# Patient Record
Sex: Female | Born: 1960
Health system: Southern US, Community
[De-identification: ages and names within clinical notes are randomized; demographics above are authoritative.]

## PROBLEM LIST (undated history)

## (undated) DIAGNOSIS — I10 Essential (primary) hypertension: Secondary | ICD-10-CM

## (undated) DIAGNOSIS — K219 Gastro-esophageal reflux disease without esophagitis: Secondary | ICD-10-CM

## (undated) DIAGNOSIS — C801 Malignant (primary) neoplasm, unspecified: Secondary | ICD-10-CM

## (undated) DIAGNOSIS — K759 Inflammatory liver disease, unspecified: Secondary | ICD-10-CM

## (undated) DIAGNOSIS — L309 Dermatitis, unspecified: Secondary | ICD-10-CM

## (undated) HISTORY — PX: NO PAST SURGERIES: SHX2092

---

## 1997-11-26 ENCOUNTER — Other Ambulatory Visit: Admission: RE | Admit: 1997-11-26 | Discharge: 1997-11-26 | Payer: Self-pay | Admitting: Obstetrics & Gynecology

## 1999-08-07 ENCOUNTER — Other Ambulatory Visit: Admission: RE | Admit: 1999-08-07 | Discharge: 1999-08-07 | Payer: Self-pay | Admitting: Obstetrics & Gynecology

## 2001-01-24 ENCOUNTER — Other Ambulatory Visit: Admission: RE | Admit: 2001-01-24 | Discharge: 2001-01-24 | Payer: Self-pay | Admitting: Obstetrics & Gynecology

## 2002-02-07 ENCOUNTER — Other Ambulatory Visit: Admission: RE | Admit: 2002-02-07 | Discharge: 2002-02-07 | Payer: Self-pay | Admitting: Obstetrics & Gynecology

## 2002-03-13 ENCOUNTER — Ambulatory Visit (HOSPITAL_COMMUNITY): Admission: RE | Admit: 2002-03-13 | Discharge: 2002-03-13 | Payer: Self-pay | Admitting: Gastroenterology

## 2002-03-13 ENCOUNTER — Encounter: Payer: Self-pay | Admitting: Gastroenterology

## 2002-03-27 ENCOUNTER — Encounter (INDEPENDENT_AMBULATORY_CARE_PROVIDER_SITE_OTHER): Payer: Self-pay | Admitting: Specialist

## 2002-03-27 ENCOUNTER — Ambulatory Visit (HOSPITAL_COMMUNITY): Admission: RE | Admit: 2002-03-27 | Discharge: 2002-03-27 | Payer: Self-pay | Admitting: Gastroenterology

## 2003-04-19 ENCOUNTER — Other Ambulatory Visit: Admission: RE | Admit: 2003-04-19 | Discharge: 2003-04-19 | Payer: Self-pay | Admitting: Obstetrics & Gynecology

## 2004-06-10 ENCOUNTER — Other Ambulatory Visit: Admission: RE | Admit: 2004-06-10 | Discharge: 2004-06-10 | Payer: Self-pay | Admitting: Obstetrics & Gynecology

## 2005-08-03 ENCOUNTER — Ambulatory Visit (HOSPITAL_COMMUNITY): Admission: RE | Admit: 2005-08-03 | Discharge: 2005-08-03 | Payer: Self-pay | Admitting: Obstetrics & Gynecology

## 2009-02-12 ENCOUNTER — Ambulatory Visit (HOSPITAL_COMMUNITY): Admission: RE | Admit: 2009-02-12 | Discharge: 2009-02-12 | Payer: Self-pay | Admitting: Obstetrics & Gynecology

## 2010-03-19 ENCOUNTER — Encounter: Admission: RE | Admit: 2010-03-19 | Discharge: 2010-03-19 | Payer: Self-pay | Admitting: Gastroenterology

## 2011-03-23 ENCOUNTER — Other Ambulatory Visit: Payer: Self-pay | Admitting: Gastroenterology

## 2011-03-23 DIAGNOSIS — B182 Chronic viral hepatitis C: Secondary | ICD-10-CM

## 2011-03-26 ENCOUNTER — Other Ambulatory Visit: Payer: Self-pay

## 2011-04-16 ENCOUNTER — Ambulatory Visit
Admission: RE | Admit: 2011-04-16 | Discharge: 2011-04-16 | Disposition: A | Discharge status: Home / Self Care | Payer: BC Managed Care – PPO | Source: Ambulatory Visit | Attending: Gastroenterology | Admitting: Gastroenterology

## 2011-04-16 DIAGNOSIS — B182 Chronic viral hepatitis C: Secondary | ICD-10-CM

## 2012-03-22 ENCOUNTER — Other Ambulatory Visit: Payer: Self-pay | Admitting: Gastroenterology

## 2012-03-22 DIAGNOSIS — B1920 Unspecified viral hepatitis C without hepatic coma: Secondary | ICD-10-CM

## 2012-04-14 ENCOUNTER — Other Ambulatory Visit: Payer: BC Managed Care – PPO

## 2012-07-13 ENCOUNTER — Ambulatory Visit
Admission: RE | Admit: 2012-07-13 | Discharge: 2012-07-13 | Disposition: A | Discharge status: Home / Self Care | Payer: PRIVATE HEALTH INSURANCE | Source: Ambulatory Visit | Attending: Gastroenterology | Admitting: Gastroenterology

## 2012-07-13 DIAGNOSIS — B1920 Unspecified viral hepatitis C without hepatic coma: Secondary | ICD-10-CM

## 2013-06-12 ENCOUNTER — Other Ambulatory Visit: Payer: Self-pay | Admitting: Gastroenterology

## 2013-06-12 DIAGNOSIS — B1920 Unspecified viral hepatitis C without hepatic coma: Secondary | ICD-10-CM

## 2013-10-26 ENCOUNTER — Other Ambulatory Visit: Payer: Self-pay | Admitting: Gastroenterology

## 2013-11-21 ENCOUNTER — Ambulatory Visit
Admission: RE | Admit: 2013-11-21 | Discharge: 2013-11-21 | Disposition: A | Discharge status: Home / Self Care | Payer: BC Managed Care – PPO | Source: Ambulatory Visit | Attending: Gastroenterology | Admitting: Gastroenterology

## 2013-11-21 DIAGNOSIS — B1920 Unspecified viral hepatitis C without hepatic coma: Secondary | ICD-10-CM

## 2014-05-01 ENCOUNTER — Other Ambulatory Visit: Payer: Self-pay | Admitting: Family Medicine

## 2014-05-01 ENCOUNTER — Ambulatory Visit
Admission: RE | Admit: 2014-05-01 | Discharge: 2014-05-01 | Disposition: A | Discharge status: Home / Self Care | Payer: BC Managed Care – PPO | Source: Ambulatory Visit | Attending: Family Medicine | Admitting: Family Medicine

## 2014-05-01 DIAGNOSIS — R221 Localized swelling, mass and lump, neck: Secondary | ICD-10-CM

## 2014-06-14 ENCOUNTER — Encounter (HOSPITAL_COMMUNITY): Payer: Self-pay | Admitting: Pharmacy Technician

## 2014-06-17 ENCOUNTER — Encounter (HOSPITAL_COMMUNITY): Payer: Self-pay | Admitting: *Deleted

## 2014-06-17 ENCOUNTER — Encounter (INDEPENDENT_AMBULATORY_CARE_PROVIDER_SITE_OTHER): Payer: BLUE CROSS/BLUE SHIELD | Admitting: Ophthalmology

## 2014-06-17 DIAGNOSIS — H35033 Hypertensive retinopathy, bilateral: Secondary | ICD-10-CM

## 2014-06-17 DIAGNOSIS — I10 Essential (primary) hypertension: Secondary | ICD-10-CM

## 2014-06-17 DIAGNOSIS — H2513 Age-related nuclear cataract, bilateral: Secondary | ICD-10-CM

## 2014-06-17 DIAGNOSIS — H4312 Vitreous hemorrhage, left eye: Secondary | ICD-10-CM

## 2014-06-17 DIAGNOSIS — H43813 Vitreous degeneration, bilateral: Secondary | ICD-10-CM

## 2014-06-17 MED ORDER — TROPICAMIDE 1 % OP SOLN: 1.0000 [drp] | OPHTHALMIC | Status: DC | PRN

## 2014-06-17 MED ORDER — CLINDAMYCIN PHOSPHATE 600 MG/50ML IV SOLN
600.0000 mg | INTRAVENOUS | Status: DC | PRN
  Administered 2014-06-18: 600 mg via INTRAVENOUS
  Filled 2014-06-17: qty 50

## 2014-06-17 MED ORDER — PHENYLEPHRINE HCL 2.5 % OP SOLN: 1.0000 [drp] | OPHTHALMIC | Status: DC | PRN

## 2014-06-17 MED ORDER — CYCLOPENTOLATE HCL 1 % OP SOLN
1.0000 [drp] | OPHTHALMIC | Status: DC | PRN
Start: 2014-06-17 — End: 2014-06-18

## 2014-06-17 MED ORDER — GATIFLOXACIN 0.5 % OP SOLN: 1.0000 [drp] | OPHTHALMIC | Status: DC | PRN

## 2014-06-17 NOTE — H&P (Signed)
Andrea Nichols is an 54 y.o. female.   Chief Complaint:Sudden floaters left eye HPI: Dense vitreous hemorrhage with retinal break left eye  Past Medical History  Diagnosis Date  . Hypertension   . GERD (gastroesophageal reflux disease)     otc meds as needed  . Cancer     skin cancer from neck  . Hepatitis     Hepatitis B  . Eczema     Past Surgical History  Procedure Laterality Date  . No past surgeries      Family History  Problem Relation Age of Onset  . Alzheimer's disease Mother   . Hypertension Mother   . CVA Father   . Diabetes type II Father    Social History:  reports that she quit smoking about 30 years ago. She has never used smokeless tobacco. She reports that she drinks about 1.2 oz of alcohol per week. She reports that she does not use illicit drugs.  Allergies:  Allergies  Allergen Reactions  . Penicillins     Turn purple from head to toe     No prescriptions prior to admission    Review of systems otherwise negative  There were no vitals taken for this visit.  Physical exam: Mental status: oriented x3. Eyes: See eye exam associated with this date of surgery in media tab.  Scanned in by scanning center Ears, Nose, Throat: within normal limits Neck: Within Normal limits General: within normal limits Chest: Within normal limits Breast: deferred Heart: Within normal limits Abdomen: Within normal limits GU: deferred Extremities: within normal limits Skin: within normal limits  Assessment/Plan Retinal break with vitreous hemorrhage left eye Plan: To [facility] for pars plana vitrectomy, laser, gas injection.  Possible scleral buckle left eye  Rajohn Henery, Chrystie Nose 06/17/2014, 11:30 AM

## 2014-06-18 ENCOUNTER — Ambulatory Visit (HOSPITAL_COMMUNITY): Payer: BLUE CROSS/BLUE SHIELD | Admitting: Certified Registered Nurse Anesthetist

## 2014-06-18 ENCOUNTER — Encounter (HOSPITAL_COMMUNITY)
Admission: RE | Disposition: A | Discharge status: Home / Self Care | Payer: Self-pay | Source: Ambulatory Visit | Attending: Ophthalmology

## 2014-06-18 ENCOUNTER — Ambulatory Visit (HOSPITAL_COMMUNITY)
Admission: RE | Admit: 2014-06-18 | Discharge: 2014-06-19 | Disposition: A | Discharge status: Home / Self Care | Payer: BLUE CROSS/BLUE SHIELD | Source: Ambulatory Visit | Attending: Ophthalmology | Admitting: Ophthalmology

## 2014-06-18 ENCOUNTER — Encounter (HOSPITAL_COMMUNITY): Payer: Self-pay | Admitting: *Deleted

## 2014-06-18 DIAGNOSIS — H33302 Unspecified retinal break, left eye: Secondary | ICD-10-CM | POA: Diagnosis not present

## 2014-06-18 DIAGNOSIS — Z880 Allergy status to penicillin: Secondary | ICD-10-CM | POA: Diagnosis not present

## 2014-06-18 DIAGNOSIS — Z87891 Personal history of nicotine dependence: Secondary | ICD-10-CM | POA: Diagnosis not present

## 2014-06-18 DIAGNOSIS — H33309 Unspecified retinal break, unspecified eye: Secondary | ICD-10-CM | POA: Diagnosis not present

## 2014-06-18 DIAGNOSIS — Z85828 Personal history of other malignant neoplasm of skin: Secondary | ICD-10-CM | POA: Insufficient documentation

## 2014-06-18 DIAGNOSIS — B1920 Unspecified viral hepatitis C without hepatic coma: Secondary | ICD-10-CM | POA: Insufficient documentation

## 2014-06-18 DIAGNOSIS — K219 Gastro-esophageal reflux disease without esophagitis: Secondary | ICD-10-CM | POA: Insufficient documentation

## 2014-06-18 DIAGNOSIS — I10 Essential (primary) hypertension: Secondary | ICD-10-CM | POA: Diagnosis not present

## 2014-06-18 DIAGNOSIS — H4312 Vitreous hemorrhage, left eye: Secondary | ICD-10-CM | POA: Diagnosis not present

## 2014-06-18 HISTORY — DX: Inflammatory liver disease, unspecified: K75.9

## 2014-06-18 HISTORY — PX: PARS PLANA VITRECTOMY: SHX2166

## 2014-06-18 HISTORY — PX: LASER PHOTO ABLATION: SHX5942

## 2014-06-18 HISTORY — DX: Dermatitis, unspecified: L30.9

## 2014-06-18 HISTORY — DX: Gastro-esophageal reflux disease without esophagitis: K21.9

## 2014-06-18 HISTORY — PX: GAS/FLUID EXCHANGE: SHX5334

## 2014-06-18 HISTORY — DX: Malignant (primary) neoplasm, unspecified: C80.1

## 2014-06-18 HISTORY — DX: Essential (primary) hypertension: I10

## 2014-06-18 LAB — CBC
HEMATOCRIT: 43.6 % (ref 36.0–46.0)
Hemoglobin: 15 g/dL (ref 12.0–15.0)
MCH: 31.6 pg (ref 26.0–34.0)
MCHC: 34.4 g/dL (ref 30.0–36.0)
MCV: 92 fL (ref 78.0–100.0)
Platelets: 231 10*3/uL (ref 150–400)
RBC: 4.74 MIL/uL (ref 3.87–5.11)
RDW: 12.3 % (ref 11.5–15.5)
WBC: 6.5 10*3/uL (ref 4.0–10.5)

## 2014-06-18 LAB — BASIC METABOLIC PANEL
Anion gap: 6 (ref 5–15)
BUN: 9 mg/dL (ref 6–23)
CHLORIDE: 108 mmol/L (ref 96–112)
CO2: 26 mmol/L (ref 19–32)
CREATININE: 0.79 mg/dL (ref 0.50–1.10)
Calcium: 9.5 mg/dL (ref 8.4–10.5)
GFR calc Af Amer: >90 mL/min (ref >90)
GFR calc non Af Amer: >90 mL/min (ref >90)
Glucose, Bld: 109 mg/dL — ABNORMAL HIGH (ref 70–99)
POTASSIUM: 4.1 mmol/L (ref 3.5–5.1)
Sodium: 140 mmol/L (ref 135–145)

## 2014-06-18 SURGERY — PARS PLANA VITRECTOMY WITH 25 GAUGE
Anesthesia: General | Site: Eye | Laterality: Left

## 2014-06-18 MED ORDER — SODIUM HYALURONATE 10 MG/ML IO SOLN
INTRAOCULAR | Status: AC
  Filled 2014-06-18: qty 0.85

## 2014-06-18 MED ORDER — LIDOCAINE HCL (CARDIAC) 20 MG/ML IV SOLN
INTRAVENOUS | Status: DC | PRN
  Administered 2014-06-18: 60 mg via INTRATRACHEAL
  Administered 2014-06-18: 100 mg via INTRAVENOUS

## 2014-06-18 MED ORDER — BUPIVACAINE HCL (PF) 0.75 % IJ SOLN
INTRAMUSCULAR | Status: DC | PRN
  Administered 2014-06-18: 20 mL

## 2014-06-18 MED ORDER — ARTIFICIAL TEARS OP OINT
TOPICAL_OINTMENT | OPHTHALMIC | Status: AC
  Filled 2014-06-18: qty 3.5

## 2014-06-18 MED ORDER — SCOPOLAMINE 1 MG/3DAYS TD PT72
MEDICATED_PATCH | TRANSDERMAL | Status: DC | PRN
  Administered 2014-06-18: 1 via TRANSDERMAL

## 2014-06-18 MED ORDER — LOSARTAN POTASSIUM-HCTZ 50-12.5 MG PO TABS: 1.0000 | ORAL_TABLET | Freq: Every day | ORAL | Status: DC

## 2014-06-18 MED ORDER — MAGNESIUM HYDROXIDE 400 MG/5ML PO SUSP: 15.0000 mL | Freq: Four times a day (QID) | ORAL | Status: DC | PRN

## 2014-06-18 MED ORDER — DEXAMETHASONE SODIUM PHOSPHATE 10 MG/ML IJ SOLN
INTRAMUSCULAR | Status: AC
  Filled 2014-06-18: qty 1

## 2014-06-18 MED ORDER — CYCLOPENTOLATE HCL 1 % OP SOLN
1.0000 [drp] | OPHTHALMIC | Status: AC | PRN
  Administered 2014-06-18 (×3): 1 [drp] via OPHTHALMIC
  Filled 2014-06-18: qty 2

## 2014-06-18 MED ORDER — LATANOPROST 0.005 % OP SOLN
1.0000 [drp] | Freq: Every day | OPHTHALMIC | Status: DC
  Filled 2014-06-18: qty 2.5

## 2014-06-18 MED ORDER — GATIFLOXACIN 0.5 % OP SOLN
1.0000 [drp] | Freq: Four times a day (QID) | OPHTHALMIC | Status: DC
  Filled 2014-06-18: qty 2.5

## 2014-06-18 MED ORDER — ROCURONIUM BROMIDE 100 MG/10ML IV SOLN
INTRAVENOUS | Status: DC | PRN
  Administered 2014-06-18: 25 mg via INTRAVENOUS

## 2014-06-18 MED ORDER — PREDNISOLONE ACETATE 1 % OP SUSP
1.0000 [drp] | Freq: Four times a day (QID) | OPHTHALMIC | Status: DC
  Filled 2014-06-18: qty 5
  Filled 2014-06-18: qty 1

## 2014-06-18 MED ORDER — FENTANYL CITRATE 0.05 MG/ML IJ SOLN
INTRAMUSCULAR | Status: AC
  Filled 2014-06-18: qty 5

## 2014-06-18 MED ORDER — 0.9 % SODIUM CHLORIDE (POUR BTL) OPTIME
TOPICAL | Status: DC | PRN
  Administered 2014-06-18: 1000 mL

## 2014-06-18 MED ORDER — ROCURONIUM BROMIDE 50 MG/5ML IV SOLN
INTRAVENOUS | Status: AC
  Filled 2014-06-18: qty 1

## 2014-06-18 MED ORDER — LOSARTAN POTASSIUM 50 MG PO TABS
50.0000 mg | ORAL_TABLET | Freq: Every day | ORAL | Status: DC
  Administered 2014-06-18: 50 mg via ORAL
  Filled 2014-06-18 (×2): qty 1

## 2014-06-18 MED ORDER — LORAZEPAM 1 MG PO TABS: 1.0000 mg | ORAL_TABLET | Freq: Every evening | ORAL | Status: DC | PRN

## 2014-06-18 MED ORDER — ARTIFICIAL TEARS OP OINT
TOPICAL_OINTMENT | OPHTHALMIC | Status: DC | PRN
  Administered 2014-06-18: 1 via OPHTHALMIC

## 2014-06-18 MED ORDER — TROPICAMIDE 1 % OP SOLN
1.0000 [drp] | OPHTHALMIC | Status: AC | PRN
  Administered 2014-06-18 (×3): 1 [drp] via OPHTHALMIC
  Filled 2014-06-18: qty 3

## 2014-06-18 MED ORDER — GENTAMICIN SULFATE 40 MG/ML IJ SOLN
INTRAMUSCULAR | Status: AC
  Filled 2014-06-18: qty 2

## 2014-06-18 MED ORDER — MIDAZOLAM HCL 5 MG/5ML IJ SOLN
INTRAMUSCULAR | Status: DC | PRN
  Administered 2014-06-18: 2 mg via INTRAVENOUS

## 2014-06-18 MED ORDER — DEXAMETHASONE SODIUM PHOSPHATE 10 MG/ML IJ SOLN
INTRAMUSCULAR | Status: DC | PRN
  Administered 2014-06-18: 10 mg via INTRAVENOUS

## 2014-06-18 MED ORDER — ACETAZOLAMIDE SODIUM 500 MG IJ SOLR
500.0000 mg | Freq: Once | INTRAMUSCULAR | Status: AC
  Administered 2014-06-19: 500 mg via INTRAVENOUS
  Filled 2014-06-18: qty 500

## 2014-06-18 MED ORDER — ONDANSETRON HCL 4 MG/2ML IJ SOLN: 4.0000 mg | Freq: Once | INTRAMUSCULAR | Status: DC | PRN

## 2014-06-18 MED ORDER — SODIUM CHLORIDE 0.9 % IJ SOLN
INTRAMUSCULAR | Status: DC | PRN
  Administered 2014-06-18: 13:00:00

## 2014-06-18 MED ORDER — TETRACAINE HCL 0.5 % OP SOLN
2.0000 [drp] | Freq: Once | OPHTHALMIC | Status: DC
  Filled 2014-06-18: qty 2

## 2014-06-18 MED ORDER — POLYMYXIN B SULFATE 500000 UNITS IJ SOLR
INTRAMUSCULAR | Status: AC
  Filled 2014-06-18: qty 1

## 2014-06-18 MED ORDER — MORPHINE SULFATE 2 MG/ML IJ SOLN: 1.0000 mg | INTRAMUSCULAR | Status: DC | PRN

## 2014-06-18 MED ORDER — ACETAMINOPHEN 325 MG PO TABS
325.0000 mg | ORAL_TABLET | ORAL | Status: DC | PRN
  Administered 2014-06-18: 650 mg via ORAL
  Administered 2014-06-18: 325 mg via ORAL
  Filled 2014-06-18: qty 2
  Filled 2014-06-18: qty 1

## 2014-06-18 MED ORDER — BACITRACIN-POLYMYXIN B 500-10000 UNIT/GM OP OINT
TOPICAL_OINTMENT | OPHTHALMIC | Status: DC | PRN
  Administered 2014-06-18: 1 via OPHTHALMIC

## 2014-06-18 MED ORDER — NEOSTIGMINE METHYLSULFATE 10 MG/10ML IV SOLN
INTRAVENOUS | Status: DC | PRN
  Administered 2014-06-18: 3 mg via INTRAVENOUS

## 2014-06-18 MED ORDER — ATROPINE SULFATE 1 % OP SOLN
OPHTHALMIC | Status: DC | PRN
  Administered 2014-06-18: 1 [drp] via OPHTHALMIC

## 2014-06-18 MED ORDER — ATROPINE SULFATE 1 % OP SOLN
OPHTHALMIC | Status: AC
  Filled 2014-06-18: qty 5

## 2014-06-18 MED ORDER — BACITRACIN-POLYMYXIN B 500-10000 UNIT/GM OP OINT
1.0000 | TOPICAL_OINTMENT | Freq: Three times a day (TID) | OPHTHALMIC | Status: DC
Start: 2014-06-19 — End: 2014-06-19
  Filled 2014-06-18: qty 3.5

## 2014-06-18 MED ORDER — DOCUSATE SODIUM 100 MG PO CAPS
100.0000 mg | ORAL_CAPSULE | Freq: Two times a day (BID) | ORAL | Status: DC
  Administered 2014-06-18: 100 mg via ORAL
  Filled 2014-06-18: qty 1

## 2014-06-18 MED ORDER — GLYCOPYRROLATE 0.2 MG/ML IJ SOLN
INTRAMUSCULAR | Status: DC | PRN
  Administered 2014-06-18: 0.4 mg via INTRAVENOUS

## 2014-06-18 MED ORDER — TEMAZEPAM 15 MG PO CAPS: 15.0000 mg | ORAL_CAPSULE | Freq: Every evening | ORAL | Status: DC | PRN

## 2014-06-18 MED ORDER — BUPIVACAINE HCL (PF) 0.75 % IJ SOLN
INTRAMUSCULAR | Status: AC
  Filled 2014-06-18: qty 10

## 2014-06-18 MED ORDER — SODIUM CHLORIDE 0.45 % IV SOLN
INTRAVENOUS | Status: DC
  Administered 2014-06-18: 20 mL/h via INTRAVENOUS

## 2014-06-18 MED ORDER — SODIUM CHLORIDE 0.9 % IJ SOLN
INTRAMUSCULAR | Status: AC
  Filled 2014-06-18: qty 10

## 2014-06-18 MED ORDER — CLOBETASOL PROPIONATE 0.05 % EX OINT
1.0000 "application " | TOPICAL_OINTMENT | Freq: Every day | CUTANEOUS | Status: DC | PRN
  Filled 2014-06-18: qty 15

## 2014-06-18 MED ORDER — ONDANSETRON HCL 4 MG/2ML IJ SOLN
INTRAMUSCULAR | Status: AC
  Filled 2014-06-18: qty 2

## 2014-06-18 MED ORDER — LACTATED RINGERS IV SOLN
INTRAVENOUS | Status: DC | PRN
  Administered 2014-06-18: 13:00:00 via INTRAVENOUS

## 2014-06-18 MED ORDER — BACITRACIN-POLYMYXIN B 500-10000 UNIT/GM OP OINT
TOPICAL_OINTMENT | OPHTHALMIC | Status: AC
  Filled 2014-06-18: qty 3.5

## 2014-06-18 MED ORDER — FENTANYL CITRATE 0.05 MG/ML IJ SOLN
INTRAMUSCULAR | Status: DC | PRN
  Administered 2014-06-18: 100 ug via INTRAVENOUS
  Administered 2014-06-18: 50 ug via INTRAVENOUS

## 2014-06-18 MED ORDER — HYDROMORPHONE HCL 1 MG/ML IJ SOLN: 0.2500 mg | INTRAMUSCULAR | Status: DC | PRN

## 2014-06-18 MED ORDER — BSS PLUS IO SOLN
INTRAOCULAR | Status: AC
  Filled 2014-06-18: qty 500

## 2014-06-18 MED ORDER — HYDROCODONE-ACETAMINOPHEN 5-325 MG PO TABS
1.0000 | ORAL_TABLET | ORAL | Status: DC | PRN
  Filled 2014-06-18: qty 1

## 2014-06-18 MED ORDER — HYDROCHLOROTHIAZIDE 12.5 MG PO CAPS
12.5000 mg | ORAL_CAPSULE | Freq: Every day | ORAL | Status: DC
  Administered 2014-06-18: 12.5 mg via ORAL
  Filled 2014-06-18 (×2): qty 1

## 2014-06-18 MED ORDER — EPINEPHRINE HCL 1 MG/ML IJ SOLN
INTRAMUSCULAR | Status: AC
  Filled 2014-06-18: qty 1

## 2014-06-18 MED ORDER — ONDANSETRON HCL 4 MG/2ML IJ SOLN
4.0000 mg | Freq: Four times a day (QID) | INTRAMUSCULAR | Status: DC
  Administered 2014-06-18 – 2014-06-19 (×3): 4 mg via INTRAVENOUS
  Filled 2014-06-18 (×2): qty 2

## 2014-06-18 MED ORDER — BSS IO SOLN
INTRAOCULAR | Status: DC | PRN
  Administered 2014-06-18: 15 mL via INTRAOCULAR

## 2014-06-18 MED ORDER — EPINEPHRINE HCL 1 MG/ML IJ SOLN
INTRAOCULAR | Status: DC | PRN
  Administered 2014-06-18: 13:00:00

## 2014-06-18 MED ORDER — GATIFLOXACIN 0.5 % OP SOLN
1.0000 [drp] | OPHTHALMIC | Status: AC | PRN
  Administered 2014-06-18 (×3): 1 [drp] via OPHTHALMIC
  Filled 2014-06-18: qty 2.5

## 2014-06-18 MED ORDER — ESTRADIOL 2 MG PO TABS
2.0000 mg | ORAL_TABLET | Freq: Every day | ORAL | Status: DC
  Administered 2014-06-18: 2 mg via ORAL
  Filled 2014-06-18 (×2): qty 1

## 2014-06-18 MED ORDER — ONDANSETRON HCL 4 MG/2ML IJ SOLN
INTRAMUSCULAR | Status: DC | PRN
  Administered 2014-06-18 (×2): 4 mg via INTRAVENOUS

## 2014-06-18 MED ORDER — SODIUM HYALURONATE 10 MG/ML IO SOLN
INTRAOCULAR | Status: DC | PRN
  Administered 2014-06-18: 0.85 mL via INTRAOCULAR

## 2014-06-18 MED ORDER — PHENYLEPHRINE HCL 2.5 % OP SOLN
1.0000 [drp] | OPHTHALMIC | Status: AC | PRN
  Administered 2014-06-18 (×3): 1 [drp] via OPHTHALMIC
  Filled 2014-06-18: qty 2

## 2014-06-18 MED ORDER — SODIUM CHLORIDE 0.9 % IV SOLN
INTRAVENOUS | Status: DC
  Administered 2014-06-18: 10:00:00 via INTRAVENOUS

## 2014-06-18 MED ORDER — BRIMONIDINE TARTRATE 0.2 % OP SOLN
1.0000 [drp] | Freq: Two times a day (BID) | OPHTHALMIC | Status: DC
Start: 2014-06-19 — End: 2014-06-19
  Filled 2014-06-18: qty 5

## 2014-06-18 MED ORDER — PROPOFOL 10 MG/ML IV BOLUS
INTRAVENOUS | Status: AC
  Filled 2014-06-18: qty 20

## 2014-06-18 MED ORDER — PROPOFOL 10 MG/ML IV BOLUS
INTRAVENOUS | Status: DC | PRN
  Administered 2014-06-18: 170 mg via INTRAVENOUS

## 2014-06-18 SURGICAL SUPPLY — 70 items
APL SRG 3 HI ABS STRL LF PLS (MISCELLANEOUS)
APPLICATOR DR MATTHEWS STRL (MISCELLANEOUS) IMPLANT
BALL CTTN LRG ABS STRL LF (GAUZE/BANDAGES/DRESSINGS) ×3
BLADE EYE CATARACT 19 1.4 BEAV (BLADE) IMPLANT
BLADE MVR KNIFE 19G (BLADE) IMPLANT
BLADE MVR KNIFE 20G (BLADE) IMPLANT
CANNULA DUAL BORE 23G (CANNULA) IMPLANT
CANNULA VLV SOFT TIP 25G (OPHTHALMIC) ×1 IMPLANT
CANNULA VLV SOFT TIP 25GA (OPHTHALMIC) ×2 IMPLANT
CORDS BIPOLAR (ELECTRODE) IMPLANT
COTTONBALL LRG STERILE PKG (GAUZE/BANDAGES/DRESSINGS) ×6 IMPLANT
COVER MAYO STAND STRL (DRAPES) ×2 IMPLANT
DRAPE INCISE 51X51 W/FILM STRL (DRAPES) ×2 IMPLANT
DRAPE OPHTHALMIC 77X100 STRL (CUSTOM PROCEDURE TRAY) ×2 IMPLANT
FILTER BLUE MILLIPORE (MISCELLANEOUS) IMPLANT
FILTER STRAW FLUID ASPIR (MISCELLANEOUS) IMPLANT
FORCEPS ECKARDT ILM 25G SERR (OPHTHALMIC RELATED) IMPLANT
FORCEPS GRIESHABER ILM 25G A (INSTRUMENTS) IMPLANT
GLOVE SS BIOGEL STRL SZ 6.5 (GLOVE) ×1 IMPLANT
GLOVE SS BIOGEL STRL SZ 7 (GLOVE) ×1 IMPLANT
GLOVE SUPERSENSE BIOGEL SZ 6.5 (GLOVE) ×1
GLOVE SUPERSENSE BIOGEL SZ 7 (GLOVE) ×1
GLOVE SURG 8.5 LATEX PF (GLOVE) ×2 IMPLANT
GOWN STRL REUS W/ TWL LRG LVL3 (GOWN DISPOSABLE) ×3 IMPLANT
GOWN STRL REUS W/TWL LRG LVL3 (GOWN DISPOSABLE) ×6
HANDLE PNEUMATIC FOR CONSTEL (OPHTHALMIC) IMPLANT
KIT BASIN OR (CUSTOM PROCEDURE TRAY) ×2 IMPLANT
KNIFE CRESCENT 2.5 55 ANG (BLADE) IMPLANT
MICROPICK 25G (MISCELLANEOUS)
NDL 18GX1X1/2 (RX/OR ONLY) (NEEDLE) ×1 IMPLANT
NDL 25GX 5/8IN NON SAFETY (NEEDLE) ×1 IMPLANT
NDL FILTER BLUNT 18X1 1/2 (NEEDLE) ×1 IMPLANT
NDL HYPO 30X.5 LL (NEEDLE) ×1 IMPLANT
NEEDLE 18GX1X1/2 (RX/OR ONLY) (NEEDLE) ×2 IMPLANT
NEEDLE 25GX 5/8IN NON SAFETY (NEEDLE) ×2 IMPLANT
NEEDLE 27GAX1X1/2 (NEEDLE) IMPLANT
NEEDLE FILTER BLUNT 18X 1/2SAF (NEEDLE) ×1
NEEDLE FILTER BLUNT 18X1 1/2 (NEEDLE) ×1 IMPLANT
NEEDLE HYPO 30X.5 LL (NEEDLE) ×2 IMPLANT
NS IRRIG 1000ML POUR BTL (IV SOLUTION) ×2 IMPLANT
PACK VITRECTOMY CUSTOM (CUSTOM PROCEDURE TRAY) ×2 IMPLANT
PAD ARMBOARD 7.5X6 YLW CONV (MISCELLANEOUS) ×4 IMPLANT
PAK PIK VITRECTOMY CVS 25GA (OPHTHALMIC) ×2 IMPLANT
PENCIL BIPOLAR 25GA STR DISP (OPHTHALMIC RELATED) IMPLANT
PIC ILLUMINATED 25G (OPHTHALMIC) ×2
PICK MICROPICK 25G (MISCELLANEOUS) IMPLANT
PIK ILLUMINATED 25G (OPHTHALMIC) ×1 IMPLANT
PROBE LASER ILLUM FLEX CVD 25G (OPHTHALMIC) IMPLANT
REPL STRA BRUSH NDL (NEEDLE) IMPLANT
REPL STRA BRUSH NEEDLE (NEEDLE) IMPLANT
RESERVOIR BACK FLUSH (MISCELLANEOUS) IMPLANT
ROLLS DENTAL (MISCELLANEOUS) ×4 IMPLANT
SCISSORS TIP ADVANCED DSP 25GA (INSTRUMENTS) IMPLANT
SCRAPER DIAMOND 25GA (OPHTHALMIC RELATED) IMPLANT
SCRAPER DIAMOND DUST MEMBRANE (MISCELLANEOUS) IMPLANT
SPONGE SURGIFOAM ABS GEL 12-7 (HEMOSTASIS) ×2 IMPLANT
STOPCOCK 4 WAY LG BORE MALE ST (IV SETS) IMPLANT
SUT CHROMIC 7 0 TG140 8 (SUTURE) IMPLANT
SUT ETHILON 10 0 CS140 6 (SUTURE) IMPLANT
SUT ETHILON 9 0 TG140 8 (SUTURE) IMPLANT
SUT POLY NON ABSORB 10-0 8 STR (SUTURE) IMPLANT
SUT SILK 4 0 RB 1 (SUTURE) IMPLANT
SYR 20CC LL (SYRINGE) ×2 IMPLANT
SYR 5ML LL (SYRINGE) IMPLANT
SYR BULB 3OZ (MISCELLANEOUS) ×2 IMPLANT
SYR TB 1ML LUER SLIP (SYRINGE) ×2 IMPLANT
SYRINGE 10CC LL (SYRINGE) IMPLANT
TOWEL OR 17X24 6PK STRL BLUE (TOWEL DISPOSABLE) ×6 IMPLANT
WATER STERILE IRR 1000ML POUR (IV SOLUTION) ×2 IMPLANT
WIPE INSTRUMENT VISIWIPE 73X73 (MISCELLANEOUS) ×2 IMPLANT

## 2014-06-18 NOTE — Anesthesia Postprocedure Evaluation (Signed)
  Anesthesia Post-op Note  Patient: Andrea Nichols  Procedure(s) Performed: Procedure(s) with comments: PARS PLANA VITRECTOMY WITH 25 GAUGE (Left) LASER PHOTO ABLATION (Left) - Endolaser and Head scope laser GAS/FLUID EXCHANGE (Left)  Patient Location: PACU  Anesthesia Type:General  Level of Consciousness: awake, alert , oriented and patient cooperative  Airway and Oxygen Therapy: Patient Spontanous Breathing  Post-op Pain: mild  Post-op Assessment: Post-op Vital signs reviewed, Patient's Cardiovascular Status Stable, Respiratory Function Stable, Patent Airway, No signs of Nausea or vomiting and Pain level controlled  Post-op Vital Signs: stable  Last Vitals:  Filed Vitals:   06/18/14 1600  BP: 142/91  Pulse: 71  Temp:   Resp: 19    Complications: No apparent anesthesia complications

## 2014-06-18 NOTE — H&P (Signed)
I examined the patient today and there is no change in the medical status 

## 2014-06-18 NOTE — Anesthesia Preprocedure Evaluation (Signed)
Anesthesia Evaluation  Patient identified by MRN, date of birth, ID band Patient awake    Reviewed: Allergy & Precautions, NPO status , Patient's Chart, lab work & pertinent test results  Airway        Dental   Pulmonary former smoker,          Cardiovascular hypertension,     Neuro/Psych    GI/Hepatic (+) Hepatitis -, C  Endo/Other    Renal/GU      Musculoskeletal   Abdominal   Peds  Hematology   Anesthesia Other Findings   Reproductive/Obstetrics                             Anesthesia Physical Anesthesia Plan  ASA: II  Anesthesia Plan: General   Post-op Pain Management:    Induction: Intravenous  Airway Management Planned: Oral ETT  Additional Equipment:   Intra-op Plan:   Post-operative Plan: Extubation in OR  Informed Consent: I have reviewed the patients History and Physical, chart, labs and discussed the procedure including the risks, benefits and alternatives for the proposed anesthesia with the patient or authorized representative who has indicated his/her understanding and acceptance.     Plan Discussed with: CRNA, Surgeon and Anesthesiologist  Anesthesia Plan Comments:         Anesthesia Quick Evaluation

## 2014-06-18 NOTE — Anesthesia Procedure Notes (Signed)
Procedure Name: Intubation Date/Time: 06/18/2014 1:07 PM Performed by: [place] Pink Pre-anesthesia Checklist: Patient identified, Emergency Drugs available, Suction available, Patient being monitored and Timeout performed Patient Re-evaluated:Patient Re-evaluated prior to inductionOxygen Delivery Method: Circle system utilized Preoxygenation: Pre-oxygenation with 100% oxygen Intubation Type: IV induction Ventilation: Mask ventilation without difficulty Laryngoscope Size: Mac and 3 Grade View: Grade I Tube type: Oral Tube size: 7.0 mm Number of attempts: 1 Airway Equipment and Method: Stylet and LTA kit utilized Placement Confirmation: ETT inserted through vocal cords under direct vision,  positive ETCO2 and breath sounds checked- equal and bilateral Secured at: 20 cm Tube secured with: Tape Dental Injury: Teeth and Oropharynx as per pre-operative assessment

## 2014-06-18 NOTE — Brief Op Note (Signed)
Brief Operative note   Preoperative diagnosis:  LEFT VITREOUS HEMORRHAGE Postoperative diagnosis  Rertinal break, vitreous hemorrhage, left eye  Procedures: Pars plana vitrectomy, laser of retinal break, gas injection left eye   Surgeon:  Hayden Pedro, MD...  Assistant:  Deatra Ina SA   Anesthesia: General  Specimen: none  Estimated blood loss:  1cc  Complications: none  Patient sent to PACU in good condition  Composed by Hayden Pedro MD  Dictation number: [id number]

## 2014-06-18 NOTE — Transfer of Care (Signed)
Immediate Anesthesia Transfer of Care Note  Patient: Andrea Nichols  Procedure(s) Performed: Procedure(s) with comments: PARS PLANA VITRECTOMY WITH 25 GAUGE (Left) LASER PHOTO ABLATION (Left) - Endolaser and Head scope laser GAS/FLUID EXCHANGE (Left)  Patient Location: PACU  Anesthesia Type:General  Level of Consciousness: awake, alert  and oriented  Airway & Oxygen Therapy: Patient Spontanous Breathing and Patient connected to nasal cannula oxygen  Post-op Assessment: Report given to RN, Post -op Vital signs reviewed and stable and Patient moving all extremities  Post vital signs: Reviewed and stable  Last Vitals:  Filed Vitals:   06/18/14 1410  BP: 155/91  Pulse: 108  Temp: 36.8 C  Resp: 22    Complications: No apparent anesthesia complications

## 2014-06-18 NOTE — Progress Notes (Signed)
Report given to robin rn as caregiver 

## 2014-06-19 ENCOUNTER — Encounter (HOSPITAL_COMMUNITY): Payer: Self-pay | Admitting: Ophthalmology

## 2014-06-19 DIAGNOSIS — H4312 Vitreous hemorrhage, left eye: Secondary | ICD-10-CM | POA: Diagnosis not present

## 2014-06-19 MED ORDER — GATIFLOXACIN 0.5 % OP SOLN: 1.0000 [drp] | Freq: Four times a day (QID) | OPHTHALMIC | Status: AC

## 2014-06-19 MED ORDER — PREDNISOLONE ACETATE 1 % OP SUSP: 1.0000 [drp] | Freq: Four times a day (QID) | OPHTHALMIC | Status: AC

## 2014-06-19 MED ORDER — BACITRACIN-POLYMYXIN B 500-10000 UNIT/GM OP OINT: 1.0000 "application " | TOPICAL_OINTMENT | Freq: Three times a day (TID) | OPHTHALMIC | Status: AC

## 2014-06-19 NOTE — Op Note (Signed)
Andrea Nichols, Andrea Nichols                   ACCOUNT NO.:  [id number]  MEDICAL RECORD NO.:  [id number]  LOCATION:  [id number]                        FACILITY:  [facility]  PHYSICIAN:  Chrystie Nose. Zigmund Daniel, M.D. DATE OF BIRTH:  12/20/1960  DATE OF PROCEDURE:  06/18/2014 DATE OF DISCHARGE:                              OPERATIVE REPORT   ADMISSION DIAGNOSES:  Vitreous hemorrhage, retinal break in the left eye.  PROCEDURES:  Pars plana vitrectomy, retinal photocoagulation of retinal break, gas fluid exchange, all in the left eye.  SURGEON:  Chrystie Nose. Zigmund Daniel, MD.  ASSISTANT:  Deatra Ina, SA.  ANESTHESIA:  General.  DETAILS:  Usual prep and drape, a 25-gauge trocars at 10, 2, and 4 o'clock infusion at 4 o'clock.  Provisc placed on the corneal surface and the BIOM viewing system was moved into place.  Pars plana vitrectomy was begun just behind the crystalline lens.  Dense blood was seen in the vitreous cavity.  This was carefully removed under low suction and rapid cutting.  Dense white membranes were also encountered and these were carefully removed under low suction and rapid cutting.  The vitrectomy was carried down to the macular surface.  Blood was vacuumed from the macular surface.  Vitrectomy was carried into the mid periphery and all vitreous in this region was removed.  The vitrectomy was carried into the far periphery.  Retinal break was seen at 7 o'clock along with a small broken blood vessel at 8 o'clock.  Additional vitrectomy was carried out with the help of scleral depression.  All blood was removed down to the vitreous base.  The widefield super wide viewing system was moved into place and additional vitrectomy was carried out into the far peripheral vitreous area.  The endolaser was positioned in the eye, 381 burns were placed around the retinal break and in some areas in the retinal periphery.  The power was 360 mW, 1000 microns each, and 0.1 seconds each.  The indirect ophthalmoscope  laser was then moved into place.  A 335 burns were placed in the superior temporal quadrant.  The power of 400 mW, 1000 microns each, and 0.1 seconds each.  The attention was carried back to the vitreous cavity where additional vitrectomy and removal of blood was performed.  A 30% gas fluid exchange was accomplished.  The instruments were removed from the eye.  The wounds were tested and found to be secure.  Polymyxin and gentamicin were irrigated into tenon space.  No atropine was used.  Marcaine was injected around the globe for postop pain.  Decadron 10 mg was injected into the lower subconjunctival space.  Closing pressure was 10 with a Baer care tonometer.  Polysporin ophthalmic ointment, a patch and shield were placed.  The patient was awakened and taken to recovery in satisfactory condition.  COMPLICATIONS:  None.  DURATION:  1 hour.     Chrystie Nose. Zigmund Daniel, M.D.     JDM/MEDQ  D:  06/18/2014  T:  06/19/2014  Job:  [id number]

## 2014-06-19 NOTE — Progress Notes (Signed)
06/19/2014, 6:44 AM  Mental Status:  Awake, Alert, Oriented  Anterior segment: Cornea  Clear    Anterior Chamber Clear    Lens:   Clear  Intra Ocular Pressure 9 mmHg with Tonopen  Vitreous: Clear 20%gas bubble   Retina:  Attached Good laser reaction   Impression: Excellent result Retina attached   Final Diagnosis: Principal Problem:   Vitreous hemorrhage, left eye Active Problems:   Retinal break of left eye   Retinal break   Plan: start post operative eye drops.  Discharge to home.  Give post operative instructions  Hayden Pedro 06/19/2014, 6:44 AM

## 2014-06-19 NOTE — Discharge Summary (Signed)
Discharge summary not needed on [facility] patients per medical records. 

## 2014-06-25 ENCOUNTER — Encounter (INDEPENDENT_AMBULATORY_CARE_PROVIDER_SITE_OTHER): Payer: BLUE CROSS/BLUE SHIELD | Admitting: Ophthalmology

## 2014-06-25 DIAGNOSIS — H33302 Unspecified retinal break, left eye: Secondary | ICD-10-CM

## 2014-07-16 ENCOUNTER — Encounter (INDEPENDENT_AMBULATORY_CARE_PROVIDER_SITE_OTHER): Payer: BLUE CROSS/BLUE SHIELD | Admitting: Ophthalmology

## 2014-07-16 DIAGNOSIS — H33302 Unspecified retinal break, left eye: Secondary | ICD-10-CM

## 2014-09-25 ENCOUNTER — Encounter (INDEPENDENT_AMBULATORY_CARE_PROVIDER_SITE_OTHER): Payer: BLUE CROSS/BLUE SHIELD | Admitting: Ophthalmology

## 2014-10-23 ENCOUNTER — Encounter (INDEPENDENT_AMBULATORY_CARE_PROVIDER_SITE_OTHER): Payer: BLUE CROSS/BLUE SHIELD | Admitting: Ophthalmology

## 2014-10-23 DIAGNOSIS — H43811 Vitreous degeneration, right eye: Secondary | ICD-10-CM

## 2014-10-23 DIAGNOSIS — H35033 Hypertensive retinopathy, bilateral: Secondary | ICD-10-CM

## 2014-10-23 DIAGNOSIS — I10 Essential (primary) hypertension: Secondary | ICD-10-CM

## 2014-10-23 DIAGNOSIS — H33302 Unspecified retinal break, left eye: Secondary | ICD-10-CM

## 2014-11-19 ENCOUNTER — Other Ambulatory Visit: Payer: Self-pay | Admitting: Gastroenterology

## 2014-11-19 DIAGNOSIS — B1920 Unspecified viral hepatitis C without hepatic coma: Secondary | ICD-10-CM

## 2014-12-02 ENCOUNTER — Ambulatory Visit
Admission: RE | Admit: 2014-12-02 | Discharge: 2014-12-02 | Disposition: A | Discharge status: Home / Self Care | Payer: BLUE CROSS/BLUE SHIELD | Source: Ambulatory Visit | Attending: Gastroenterology | Admitting: Gastroenterology

## 2014-12-02 DIAGNOSIS — B1920 Unspecified viral hepatitis C without hepatic coma: Secondary | ICD-10-CM

## 2014-12-17 ENCOUNTER — Other Ambulatory Visit: Payer: Self-pay | Admitting: Obstetrics & Gynecology

## 2014-12-18 LAB — CYTOLOGY - PAP

## 2015-01-31 ENCOUNTER — Other Ambulatory Visit (HOSPITAL_COMMUNITY): Payer: Self-pay | Admitting: Family Medicine

## 2015-01-31 DIAGNOSIS — R4789 Other speech disturbances: Secondary | ICD-10-CM

## 2015-01-31 DIAGNOSIS — R413 Other amnesia: Secondary | ICD-10-CM

## 2015-01-31 DIAGNOSIS — I69998 Other sequelae following unspecified cerebrovascular disease: Principal | ICD-10-CM

## 2015-01-31 DIAGNOSIS — H539 Unspecified visual disturbance: Secondary | ICD-10-CM

## 2015-02-05 ENCOUNTER — Ambulatory Visit (HOSPITAL_COMMUNITY): Payer: BLUE CROSS/BLUE SHIELD

## 2015-02-10 ENCOUNTER — Ambulatory Visit (HOSPITAL_COMMUNITY)
Admission: RE | Admit: 2015-02-10 | Discharge: 2015-02-10 | Disposition: A | Discharge status: Home / Self Care | Payer: 59 | Source: Ambulatory Visit | Attending: Family Medicine | Admitting: Family Medicine

## 2015-02-10 DIAGNOSIS — I10 Essential (primary) hypertension: Secondary | ICD-10-CM | POA: Insufficient documentation

## 2015-02-10 DIAGNOSIS — I69998 Other sequelae following unspecified cerebrovascular disease: Secondary | ICD-10-CM | POA: Insufficient documentation

## 2015-02-10 DIAGNOSIS — R4789 Other speech disturbances: Secondary | ICD-10-CM | POA: Insufficient documentation

## 2015-02-10 DIAGNOSIS — H539 Unspecified visual disturbance: Secondary | ICD-10-CM | POA: Diagnosis not present

## 2015-02-10 DIAGNOSIS — R413 Other amnesia: Secondary | ICD-10-CM | POA: Insufficient documentation

## 2015-02-10 LAB — POCT I-STAT CREATININE: Creatinine, Ser: 0.8 mg/dL (ref 0.44–1.00)

## 2015-02-10 MED ORDER — GADOBENATE DIMEGLUMINE 529 MG/ML IV SOLN
15.0000 mL | Freq: Once | INTRAVENOUS | Status: AC
  Administered 2015-02-10: 15 mL via INTRAVENOUS

## 2015-05-22 DIAGNOSIS — H25012 Cortical age-related cataract, left eye: Secondary | ICD-10-CM | POA: Diagnosis not present

## 2015-05-22 DIAGNOSIS — H2512 Age-related nuclear cataract, left eye: Secondary | ICD-10-CM | POA: Diagnosis not present

## 2015-05-22 DIAGNOSIS — H2511 Age-related nuclear cataract, right eye: Secondary | ICD-10-CM | POA: Diagnosis not present

## 2015-05-22 DIAGNOSIS — H25011 Cortical age-related cataract, right eye: Secondary | ICD-10-CM | POA: Diagnosis not present

## 2015-05-22 DIAGNOSIS — H35033 Hypertensive retinopathy, bilateral: Secondary | ICD-10-CM | POA: Diagnosis not present

## 2015-05-27 MED FILL — OFLOXACIN 0.3% EYE DROPS: 0.3 | 13 days supply | Qty: 5 | Fill #0

## 2015-05-27 MED FILL — PROLENSA 0.07% EYE DROPS: 0.07 | 60 days supply | Qty: 3 | Fill #0

## 2015-05-27 MED FILL — PREDNISOLONE AC 1% EYE DROP: 1 | 25 days supply | Qty: 5 | Fill #0

## 2015-05-27 MED FILL — BRIMONIDINE 0.2% EYE DROP: 0.2 | 90 days supply | Qty: 10 | Fill #0

## 2015-06-03 DIAGNOSIS — H2512 Age-related nuclear cataract, left eye: Secondary | ICD-10-CM | POA: Diagnosis not present

## 2015-07-09 MED FILL — OLMESARTAN-HCTZ 20-12.5 MG: 20-12.5 | 30 days supply | Qty: 30 | Fill #0

## 2015-07-28 DIAGNOSIS — L309 Dermatitis, unspecified: Secondary | ICD-10-CM | POA: Diagnosis not present

## 2015-07-31 MED FILL — MOMETASONE FUROATE 0.1% OIN: 0.1 | 20 days supply | Qty: 45 | Fill #0

## 2015-11-19 ENCOUNTER — Other Ambulatory Visit: Payer: Self-pay | Admitting: Gastroenterology

## 2015-11-19 DIAGNOSIS — B182 Chronic viral hepatitis C: Secondary | ICD-10-CM

## 2015-12-09 ENCOUNTER — Other Ambulatory Visit: Payer: BLUE CROSS/BLUE SHIELD

## 2015-12-10 ENCOUNTER — Ambulatory Visit
Admission: RE | Admit: 2015-12-10 | Discharge: 2015-12-10 | Disposition: A | Discharge status: Home / Self Care | Payer: PRIVATE HEALTH INSURANCE | Source: Ambulatory Visit | Attending: Gastroenterology | Admitting: Gastroenterology

## 2015-12-10 DIAGNOSIS — B182 Chronic viral hepatitis C: Secondary | ICD-10-CM

## 2017-03-02 IMAGING — US US ABDOMEN COMPLETE
1 series · 14 of 25 positions shown · non-contrast
Comparison: Ultrasound of the abdomen of 12/02/2014

CLINICAL DATA: Chronic hepatitis-C

EXAM:
ABDOMEN ULTRASOUND COMPLETE

[Series 1: us abdomen complete · 0.32mm/px · 14 of 86 slices shown]
[im 1/86]
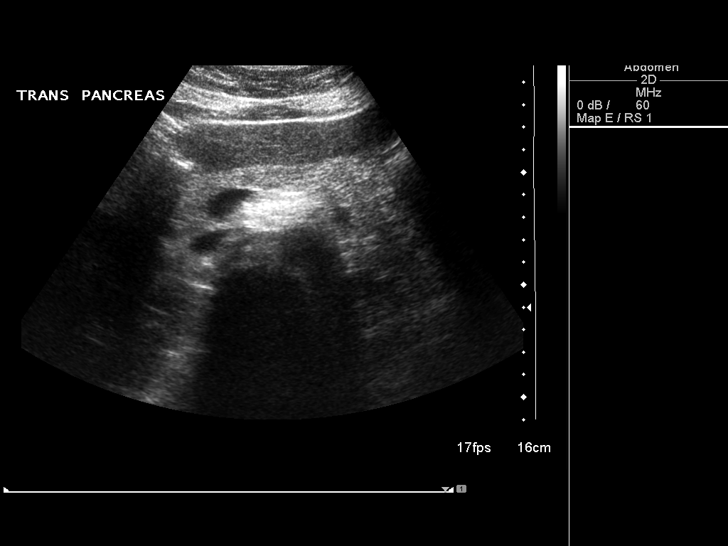
[im 8/86]
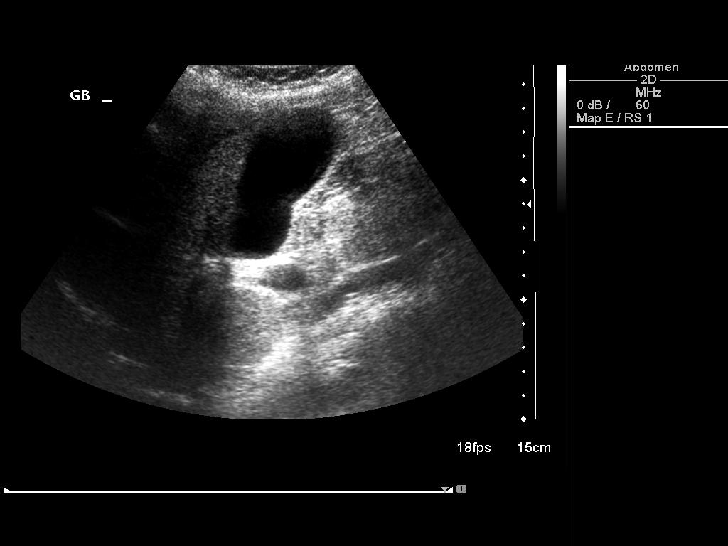
[im 15/86]
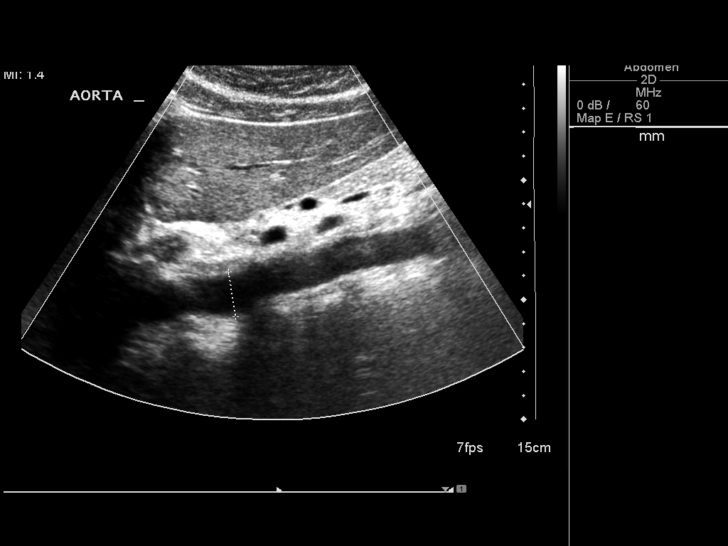
[im 22/86]
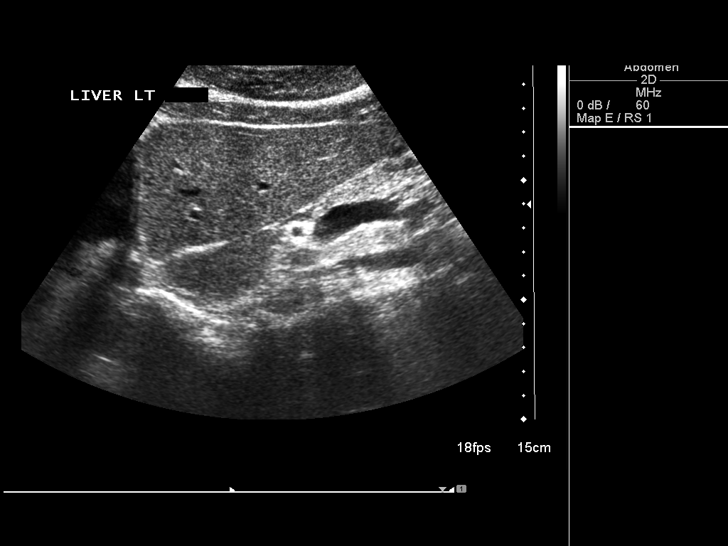
[im 29/86]
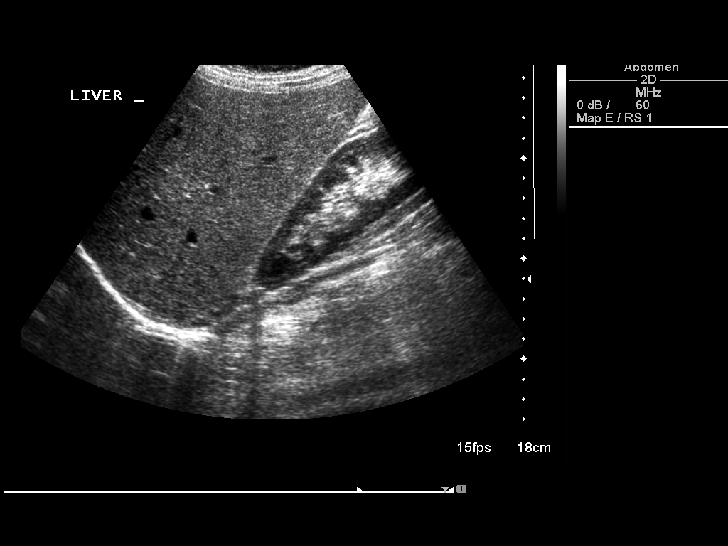
[im 32/86]
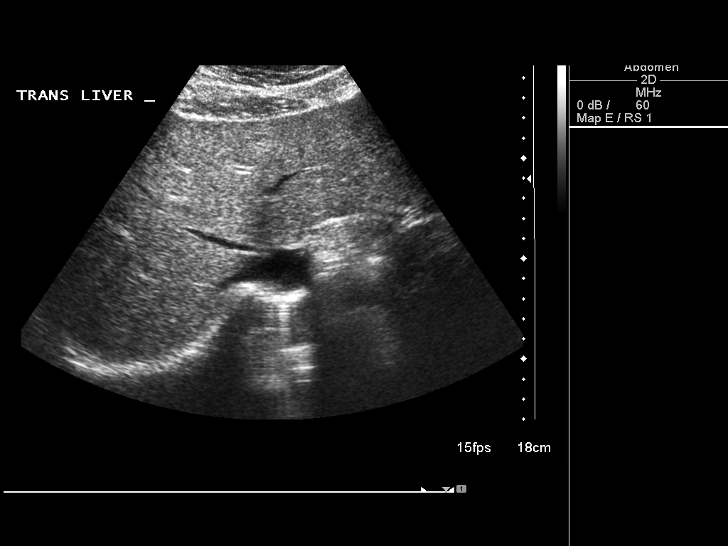
[im 39/86]
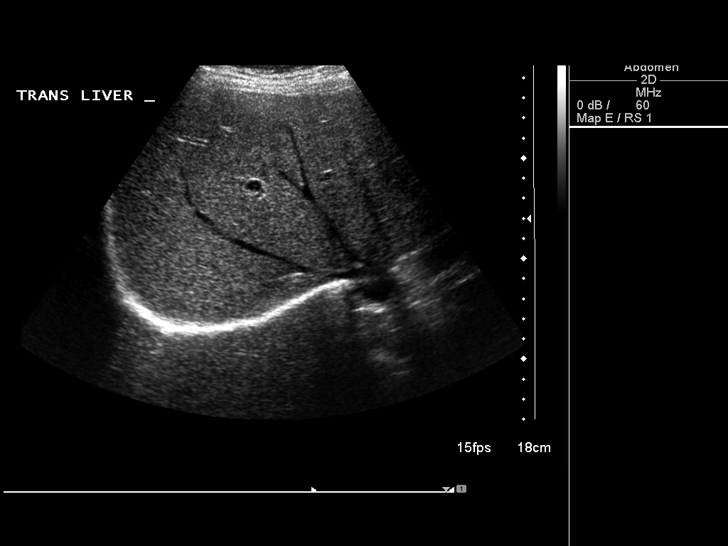
[im 47/86]
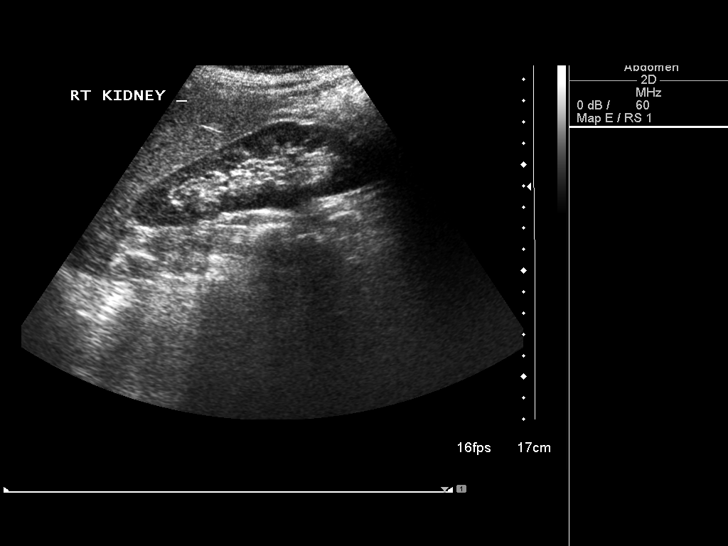
[im 54/86]
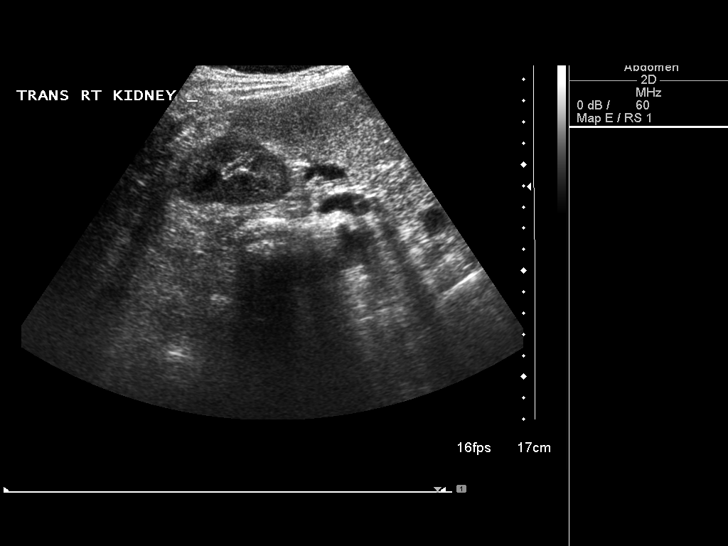
[im 57/86]
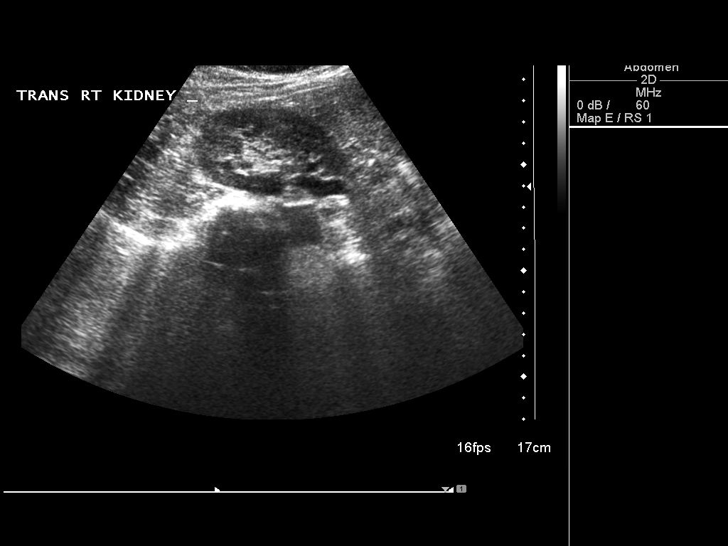
[im 64/86]
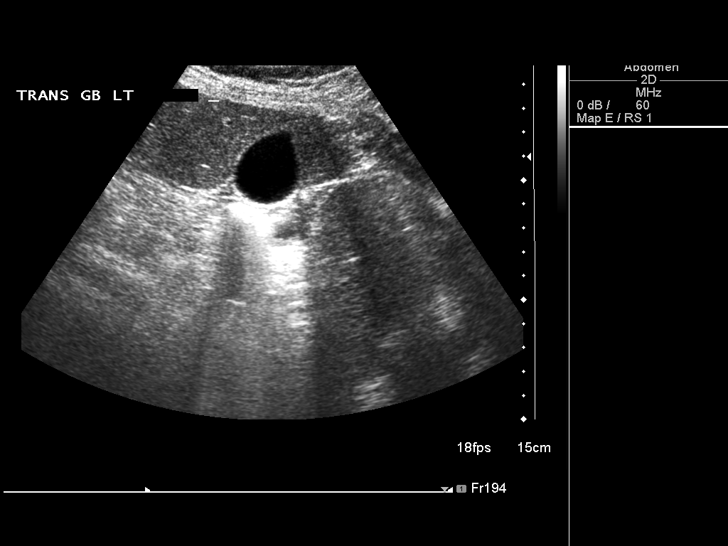
[im 71/86]
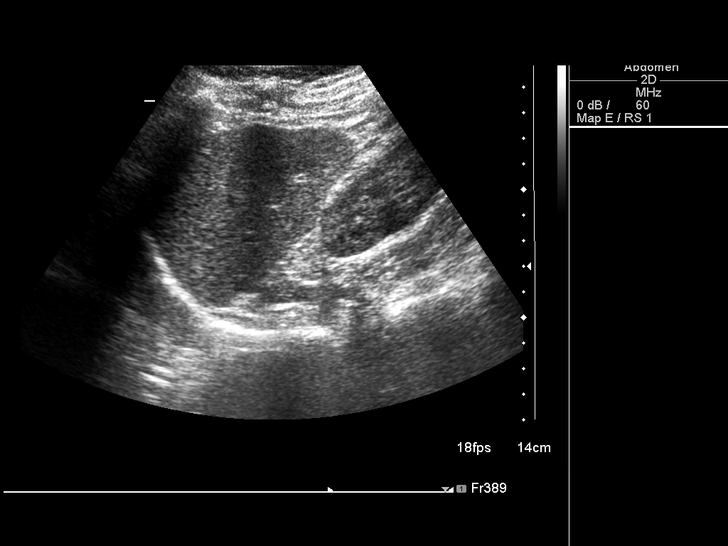
[im 78/86]
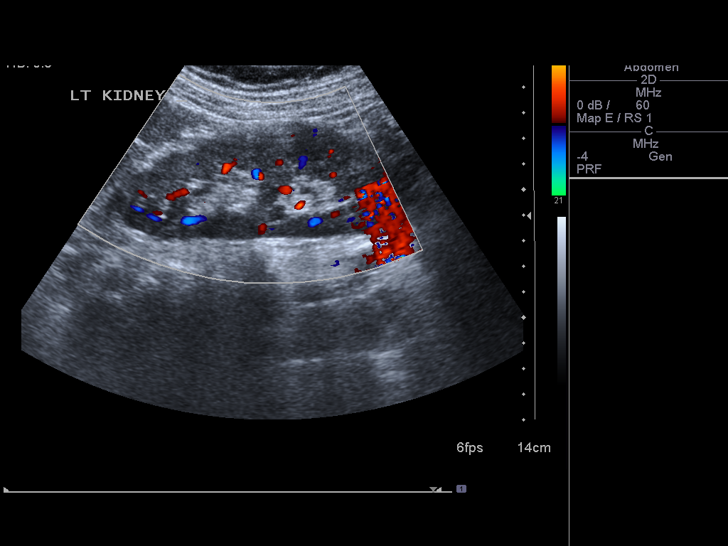
[im 86/86]
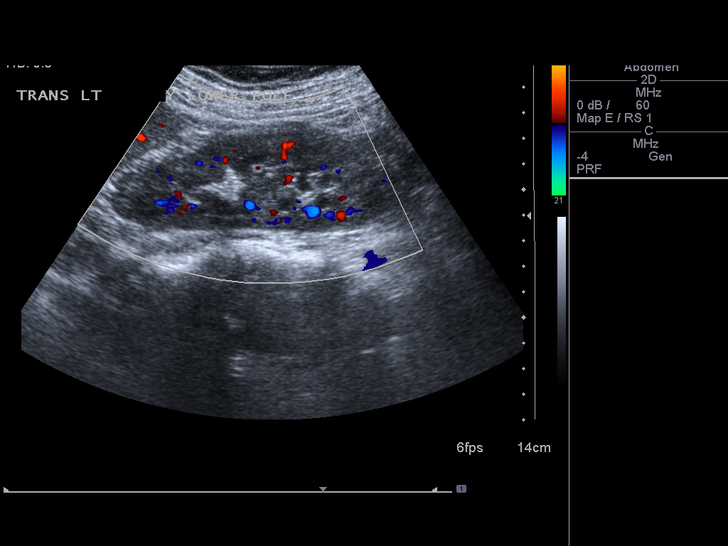

[14 of 25 positions shown; findings below may reference images not displayed]

FINDINGS: Gallbladder: The gallbladder is visualized and no gallstones are
noted. There is no pain over the gallbladder with compression.

Common bile duct: Diameter: The common bile duct is normal measuring
4.2 mm.

Liver: The liver is echogenic as noted previously consistent with
diffuse fatty infiltration. No focal hepatic abnormality is seen.

IVC: No abnormality visualized.

Pancreas: The pancreas is moderately well visualized with no
abnormality noted. The pancreatic duct is not dilated.

Spleen: The spleen measures 6.3 cm.

Right Kidney: Length: 12.6 cm..  No hydronephrosis is seen.

Left Kidney: Length: 11.7 cm..  No hydronephrosis is noted.

Abdominal aorta: The abdominal aorta is normal caliber.

Other findings: None.
IMPRESSION: 1. No gallstones.
2. Echogenic liver parenchyma consistent with diffuse fatty
infiltration. No focal hepatic abnormality.
3. No hydronephrosis.

## 2017-08-24 DIAGNOSIS — Z961 Presence of intraocular lens: Secondary | ICD-10-CM | POA: Diagnosis not present

## 2017-10-11 MED FILL — OLMESARTAN-HCTZ 20-12.5 MG: 20-12.5 | 90 days supply | Qty: 90 | Fill #0

## 2017-11-02 MED FILL — ALPRAZolam 0.5 MG TABS: 0.5 | 30 days supply | Qty: 30 | Fill #0

## 2017-11-02 MED FILL — ESTRADIOL 2 MG TABLET: 2 | 90 days supply | Qty: 90 | Fill #0

## 2017-12-07 MED FILL — ALPRAZolam 0.5 MG TABS: 0.5 | 30 days supply | Qty: 30 | Fill #1

## 2017-12-08 DIAGNOSIS — I10 Essential (primary) hypertension: Secondary | ICD-10-CM | POA: Diagnosis not present

## 2017-12-08 DIAGNOSIS — R5383 Other fatigue: Secondary | ICD-10-CM | POA: Diagnosis not present

## 2017-12-16 DIAGNOSIS — F5104 Psychophysiologic insomnia: Secondary | ICD-10-CM | POA: Diagnosis not present

## 2017-12-16 DIAGNOSIS — R7989 Other specified abnormal findings of blood chemistry: Secondary | ICD-10-CM | POA: Diagnosis not present

## 2017-12-16 DIAGNOSIS — E559 Vitamin D deficiency, unspecified: Secondary | ICD-10-CM | POA: Diagnosis not present

## 2017-12-16 DIAGNOSIS — I10 Essential (primary) hypertension: Secondary | ICD-10-CM | POA: Diagnosis not present

## 2017-12-16 DIAGNOSIS — R5383 Other fatigue: Secondary | ICD-10-CM | POA: Diagnosis not present

## 2017-12-16 DIAGNOSIS — F411 Generalized anxiety disorder: Secondary | ICD-10-CM | POA: Diagnosis not present

## 2017-12-16 DIAGNOSIS — R221 Localized swelling, mass and lump, neck: Secondary | ICD-10-CM | POA: Diagnosis not present

## 2018-01-06 MED FILL — ALPRAZolam 0.25 MG TABS: 0.25 | 30 days supply | Qty: 30 | Fill #0

## 2018-01-06 MED FILL — OLMESARTAN-HCTZ 20-12.5 MG: 20-12.5 | 60 days supply | Qty: 60 | Fill #1

## 2018-02-06 MED FILL — ALPRAZolam 0.25 MG TABS: 0.25 | 30 days supply | Qty: 30 | Fill #1

## 2018-02-06 MED FILL — ESTRADIOL 2 MG TABLET: 2 | 90 days supply | Qty: 90 | Fill #1

## 2018-03-07 MED FILL — OLMESARTAN-HCTZ 20-12.5 MG: 20-12.5 | 90 days supply | Qty: 90 | Fill #0

## 2018-03-07 MED FILL — ALPRAZolam 0.25 MG TABS: 0.25 | 30 days supply | Qty: 30 | Fill #2

## 2018-04-05 MED FILL — ALPRAZolam 0.25 MG TABS: 0.25 | 30 days supply | Qty: 30 | Fill #3

## 2018-05-05 MED FILL — ALPRAZolam 0.25 MG TABS: 0.25 | 30 days supply | Qty: 30 | Fill #4

## 2018-05-31 DIAGNOSIS — Z6827 Body mass index (BMI) 27.0-27.9, adult: Secondary | ICD-10-CM | POA: Diagnosis not present

## 2018-05-31 DIAGNOSIS — Z1231 Encounter for screening mammogram for malignant neoplasm of breast: Secondary | ICD-10-CM | POA: Diagnosis not present

## 2018-05-31 DIAGNOSIS — Z01419 Encounter for gynecological examination (general) (routine) without abnormal findings: Secondary | ICD-10-CM | POA: Diagnosis not present

## 2018-06-01 MED FILL — ESTRADIOL 2 MG TABLET: 2 | 90 days supply | Qty: 90 | Fill #0

## 2018-06-05 MED FILL — OLMESARTAN-HCTZ 20-12.5 MG: 20-12.5 | 90 days supply | Qty: 90 | Fill #1

## 2018-06-06 MED FILL — ALPRAZolam 0.25 MG TABS: 0.25 | 30 days supply | Qty: 30 | Fill #5

## 2018-06-15 DIAGNOSIS — R7989 Other specified abnormal findings of blood chemistry: Secondary | ICD-10-CM | POA: Diagnosis not present

## 2018-06-15 DIAGNOSIS — R5383 Other fatigue: Secondary | ICD-10-CM | POA: Diagnosis not present

## 2018-06-15 DIAGNOSIS — E559 Vitamin D deficiency, unspecified: Secondary | ICD-10-CM | POA: Diagnosis not present

## 2018-06-21 DIAGNOSIS — I10 Essential (primary) hypertension: Secondary | ICD-10-CM | POA: Diagnosis not present

## 2018-06-21 DIAGNOSIS — F5104 Psychophysiologic insomnia: Secondary | ICD-10-CM | POA: Diagnosis not present

## 2018-06-21 DIAGNOSIS — E559 Vitamin D deficiency, unspecified: Secondary | ICD-10-CM | POA: Diagnosis not present

## 2018-06-21 DIAGNOSIS — F411 Generalized anxiety disorder: Secondary | ICD-10-CM | POA: Diagnosis not present

## 2018-07-06 MED FILL — ALPRAZolam 0.25 MG TABS: 0.25 | 30 days supply | Qty: 30 | Fill #0

## 2018-08-04 MED FILL — ALPRAZolam 0.25 MG TABS: 0.25 | 30 days supply | Qty: 30 | Fill #0

## 2018-08-05 MED FILL — ESTRADIOL 2 MG TABS: 2 | 90 days supply | Qty: 90 | Fill #0

## 2018-08-23 MED FILL — OLMESARTAN-HCTZ 20-12.5 MG: 20-12.5 | 90 days supply | Qty: 90 | Fill #0

## 2018-09-04 MED FILL — ALPRAZolam 0.25 MG TABS: 0.25 | 30 days supply | Qty: 30 | Fill #1

## 2018-10-05 MED FILL — ALPRAZolam 0.25 MG TABS: 0.25 | 30 days supply | Qty: 30 | Fill #2

## 2018-10-28 MED FILL — ESTRADIOL 2 MG TABS: 2 | 90 days supply | Qty: 90 | Fill #1

## 2018-11-03 MED FILL — ALPRAZolam 0.25 MG TABS: 0.25 | 30 days supply | Qty: 30 | Fill #3

## 2018-11-15 MED FILL — OLMESARTAN-HCTZ 20-12.5 MG: 20-12.5 | 90 days supply | Qty: 90 | Fill #1

## 2018-11-21 DIAGNOSIS — H35031 Hypertensive retinopathy, right eye: Secondary | ICD-10-CM | POA: Diagnosis not present

## 2018-12-04 MED FILL — ALPRAZolam 0.25 MG TABS: 0.25 | 30 days supply | Qty: 30 | Fill #4

## 2018-12-14 DIAGNOSIS — R7989 Other specified abnormal findings of blood chemistry: Secondary | ICD-10-CM | POA: Diagnosis not present

## 2018-12-14 DIAGNOSIS — E559 Vitamin D deficiency, unspecified: Secondary | ICD-10-CM | POA: Diagnosis not present

## 2018-12-14 DIAGNOSIS — I10 Essential (primary) hypertension: Secondary | ICD-10-CM | POA: Diagnosis not present

## 2018-12-20 DIAGNOSIS — I10 Essential (primary) hypertension: Secondary | ICD-10-CM | POA: Diagnosis not present

## 2018-12-20 DIAGNOSIS — F411 Generalized anxiety disorder: Secondary | ICD-10-CM | POA: Diagnosis not present

## 2018-12-20 DIAGNOSIS — E559 Vitamin D deficiency, unspecified: Secondary | ICD-10-CM | POA: Diagnosis not present

## 2018-12-20 DIAGNOSIS — R7989 Other specified abnormal findings of blood chemistry: Secondary | ICD-10-CM | POA: Diagnosis not present

## 2018-12-20 DIAGNOSIS — F5104 Psychophysiologic insomnia: Secondary | ICD-10-CM | POA: Diagnosis not present

## 2018-12-20 DIAGNOSIS — R41840 Attention and concentration deficit: Secondary | ICD-10-CM | POA: Diagnosis not present

## 2018-12-20 MED FILL — buPROPion HCL 75 MG TABS: 75 | 30 days supply | Qty: 60 | Fill #0

## 2018-12-22 MED FILL — ALPRAZolam 0.25 MG TABS: 0.25 | 30 days supply | Qty: 45 | Fill #0

## 2019-01-13 MED FILL — buPROPion HCL 75 MG TABS: 75 | 30 days supply | Qty: 60 | Fill #1

## 2019-01-23 MED FILL — ALPRAZolam 0.25 MG TABS: 0.25 | 30 days supply | Qty: 45 | Fill #1

## 2019-01-26 MED FILL — ESTRADIOL 2 MG TABS: 2 | 90 days supply | Qty: 90 | Fill #2

## 2019-02-13 MED FILL — OLMESARTAN-HCTZ 20-12.5 MG: 20-12.5 | 30 days supply | Qty: 30 | Fill #0

## 2019-03-07 MED FILL — ALPRAZolam 0.25 MG TABS: 0.25 | 30 days supply | Qty: 45 | Fill #2

## 2019-03-09 MED FILL — OLMESARTAN-HCTZ 20-12.5 MG: 20-12.5 | 30 days supply | Qty: 30 | Fill #1

## 2019-04-10 MED FILL — OLMESARTAN-HCTZ 20-12.5 MG: 20-12.5 | 30 days supply | Qty: 30 | Fill #2

## 2019-04-11 MED FILL — ALPRAZolam 0.25 MG TABS: 0.25 | 30 days supply | Qty: 45 | Fill #0

## 2019-04-27 MED FILL — ESTRADIOL 2 MG TABS: 2 | 90 days supply | Qty: 90 | Fill #3

## 2019-05-08 MED FILL — OLMESARTAN-HCTZ 20-12.5 MG: 20-12.5 | 30 days supply | Qty: 30 | Fill #3

## 2019-05-09 MED FILL — ALPRAZolam 0.25 MG TABS: 0.25 | 30 days supply | Qty: 45 | Fill #1

## 2019-06-08 MED FILL — ALPRAZolam 0.25 MG TABS: 0.25 | 30 days supply | Qty: 45 | Fill #2

## 2019-06-08 MED FILL — OLMESARTAN-HCTZ 20-12.5 MG: 20-12.5 | 30 days supply | Qty: 30 | Fill #4

## 2019-07-07 MED FILL — OLMESARTAN-HCTZ 20-12.5 MG: 20-12.5 | 30 days supply | Qty: 30 | Fill #5

## 2019-07-09 MED FILL — ALPRAZolam 0.25 MG TABS: 0.25 | 30 days supply | Qty: 45 | Fill #0

## 2019-08-06 MED FILL — ALPRAZolam 0.25 MG TABS: 0.25 | 30 days supply | Qty: 45 | Fill #0

## 2019-08-06 MED FILL — OLMESARTAN-HCTZ 20-12.5 MG: 20-12.5 | 30 days supply | Qty: 30 | Fill #0

## 2023-03-03 ENCOUNTER — Other Ambulatory Visit (HOSPITAL_COMMUNITY): Payer: Self-pay | Admitting: Family Medicine

## 2023-03-03 DIAGNOSIS — Z780 Asymptomatic menopausal state: Secondary | ICD-10-CM

## 2023-03-03 DIAGNOSIS — Z1231 Encounter for screening mammogram for malignant neoplasm of breast: Secondary | ICD-10-CM

## 2023-03-18 ENCOUNTER — Encounter (HOSPITAL_COMMUNITY): Payer: Self-pay

## 2023-03-18 ENCOUNTER — Other Ambulatory Visit (HOSPITAL_COMMUNITY): Payer: PRIVATE HEALTH INSURANCE

## 2023-03-18 ENCOUNTER — Ambulatory Visit (HOSPITAL_COMMUNITY): Payer: PRIVATE HEALTH INSURANCE
